# Patient Record
Sex: Male | Born: 1995 | Race: White | Hispanic: No | Marital: Single | Smoking: Former smoker
Health system: Southern US, Community
[De-identification: ages and names within clinical notes are randomized; demographics above are authoritative.]

---

## 2020-01-17 ENCOUNTER — Other Ambulatory Visit: Payer: Self-pay

## 2020-01-18 ENCOUNTER — Other Ambulatory Visit: Payer: Self-pay

## 2020-01-18 ENCOUNTER — Encounter: Payer: Self-pay | Admitting: Medical

## 2020-01-18 ENCOUNTER — Ambulatory Visit: Payer: 59 | Admitting: Medical

## 2020-01-18 VITALS — BP 118/76 | HR 99 | Ht 72.0 in | Wt 260.2 lb

## 2020-01-18 DIAGNOSIS — G8929 Other chronic pain: Secondary | ICD-10-CM

## 2020-01-18 DIAGNOSIS — D179 Benign lipomatous neoplasm, unspecified: Secondary | ICD-10-CM

## 2020-01-18 DIAGNOSIS — M545 Low back pain: Secondary | ICD-10-CM

## 2020-01-18 MED ORDER — DICLOFENAC SODIUM 75 MG PO TBEC: 75.0000 mg | DELAYED_RELEASE_TABLET | Freq: Two times a day (BID) | ORAL | 0 refills | Status: AC

## 2020-01-18 MED ORDER — CYCLOBENZAPRINE HCL 10 MG PO TABS: 10.0000 mg | ORAL_TABLET | Freq: Every day | ORAL | 0 refills | Status: AC

## 2020-01-18 NOTE — Patient Instructions (Addendum)
For recent back pain chronic and recurrent recently, will rx flexeril muscle relaxant to use at night and diclofenac nsaid to use twice daily.  Back exercises as tolerated.  If pain worsen or changes the notify us.  Defer xray to sport med if thinks necessary.  Also get opinion on lipoma vs ganglion cyst left upper ext.  Follow up date to be determined.   Back Exercises These exercises help to make your trunk and back strong. They also help to keep the lower back flexible. Doing these exercises can help to prevent back pain or lessen existing pain.  If you have back pain, try to do these exercises 2-3 times each day or as told by your doctor.  As you get better, do the exercises once each day. Repeat the exercises more often as told by your doctor.  To stop back pain from coming back, do the exercises once each day, or as told by your doctor. Exercises Single knee to chest Do these steps 3-5 times in a row for each leg: 1. Lie on your back on a firm bed or the floor with your legs stretched out. 2. Bring one knee to your chest. 3. Grab your knee or thigh with both hands and hold them it in place. 4. Pull on your knee until you feel a gentle stretch in your lower back or buttocks. 5. Keep doing the stretch for 10-30 seconds. 6. Slowly let go of your leg and straighten it. Pelvic tilt Do these steps 5-10 times in a row: 1. Lie on your back on a firm bed or the floor with your legs stretched out. 2. Bend your knees so they point up to the ceiling. Your feet should be flat on the floor. 3. Tighten your lower belly (abdomen) muscles to press your lower back against the floor. This will make your tailbone point up to the ceiling instead of pointing down to your feet or the floor. 4. Stay in this position for 5-10 seconds while you gently tighten your muscles and breathe evenly. Cat-cow Do these steps until your lower back bends more easily: 1. Get on your hands and knees on a firm  surface. Keep your hands under your shoulders, and keep your knees under your hips. You may put padding under your knees. 2. Let your head hang down toward your chest. Tighten (contract) the muscles in your belly. Point your tailbone toward the floor so your lower back becomes rounded like the back of a cat. 3. Stay in this position for 5 seconds. 4. Slowly lift your head. Let the muscles of your belly relax. Point your tailbone up toward the ceiling so your back forms a sagging arch like the back of a cow. 5. Stay in this position for 5 seconds.  Press-ups Do these steps 5-10 times in a row: 1. Lie on your belly (face-down) on the floor. 2. Place your hands near your head, about shoulder-width apart. 3. While you keep your back relaxed and keep your hips on the floor, slowly straighten your arms to raise the top half of your body and lift your shoulders. Do not use your back muscles. You may change where you place your hands in order to make yourself more comfortable. 4. Stay in this position for 5 seconds. 5. Slowly return to lying flat on the floor.  Bridges Do these steps 10 times in a row: 1. Lie on your back on a firm surface. 2. Bend your knees so they point  up to the ceiling. Your feet should be flat on the floor. Your arms should be flat at your sides, next to your body. 3. Tighten your butt muscles and lift your butt off the floor until your waist is almost as high as your knees. If you do not feel the muscles working in your butt and the back of your thighs, slide your feet 1-2 inches farther away from your butt. 4. Stay in this position for 3-5 seconds. 5. Slowly lower your butt to the floor, and let your butt muscles relax. If this exercise is too easy, try doing it with your arms crossed over your chest. Belly crunches Do these steps 5-10 times in a row: 1. Lie on your back on a firm bed or the floor with your legs stretched out. 2. Bend your knees so they point up to the  ceiling. Your feet should be flat on the floor. 3. Cross your arms over your chest. 4. Tip your chin a little bit toward your chest but do not bend your neck. 5. Tighten your belly muscles and slowly raise your chest just enough to lift your shoulder blades a tiny bit off of the floor. Avoid raising your body higher than that, because it can put too much stress on your low back. 6. Slowly lower your chest and your head to the floor. Back lifts Do these steps 5-10 times in a row: 1. Lie on your belly (face-down) with your arms at your sides, and rest your forehead on the floor. 2. Tighten the muscles in your legs and your butt. 3. Slowly lift your chest off of the floor while you keep your hips on the floor. Keep the back of your head in line with the curve in your back. Look at the floor while you do this. 4. Stay in this position for 3-5 seconds. 5. Slowly lower your chest and your face to the floor. Contact a doctor if:  Your back pain gets a lot worse when you do an exercise.  Your back pain does not get better 2 hours after you exercise. If you have any of these problems, stop doing the exercises. Do not do them again unless your doctor says it is okay. Get help right away if:  You have sudden, very bad back pain. If this happens, stop doing the exercises. Do not do them again unless your doctor says it is okay. This information is not intended to replace advice given to you by your health care provider. Make sure you discuss any questions you have with your health care provider. Document Revised: 08/24/2018 Document Reviewed: 08/24/2018 Elsevier Patient Education  2020 [employer].

## 2020-01-18 NOTE — Progress Notes (Signed)
Subjective:    Patient ID: Todd Robinson, male    DOB: 03-05-1996, 24 y.o.   MRN: [id number]  HPI  Pt in for first time.  Pt states has had no pcp since 45 yo.   Pt from [place]. Pt girlfriend graduated from [employer] so he moved down. 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 He might go to college local. Pt works out occasionally. He eats moderate healthy. Non smoker. Very rare alcohol use.  Pt has lower back pain recently. He states pain started 4-6 months ago. He was getting treated by chiropracter for weeks. Pain did decrease gradually to almost no pain but then 2 weeks ago pain came back. Pain was about 8/10 about one week ago. But now 5/10. Pt states 4 months ago had radiating pain down both legs. But no radiating pain since.  Back reoccurence did not occur with injury or excessive activity.   Also pt has small lump left mid forearm and one on upper left bicep area.   Review of Systems  Constitutional: Negative for chills, fatigue and fever.  Respiratory: Negative for cough, chest tightness, shortness of breath and wheezing.   Cardiovascular: Negative for chest pain and palpitations.  Gastrointestinal: Negative for abdominal pain, blood in stool, diarrhea, nausea and vomiting.  Musculoskeletal: Positive for back pain.  Skin: Negative for rash.       Ganglion cyst vs lipoma.  Neurological: Negative for dizziness and headaches.  Hematological: Negative for adenopathy. Does not bruise/bleed easily.  Psychiatric/Behavioral: Negative for confusion and self-injury. The patient is not nervous/anxious.     No past medical history on file.   Social History   Socioeconomic History  . Marital status: Single    Spouse name: Not on file  . Number of children: Not on file  . Years of education: Not on file  . Highest education level: Not on file  Occupational History  . Not on file  Tobacco Use  . Smoking status: Former Smoker    Packs/day: 1.00    Types:  Cigarettes  . Smokeless tobacco: Never Used  Substance and Sexual Activity  . Alcohol use: Yes    Comment: occ.  . Drug use: Never  . Sexual activity: Yes    Birth control/protection: Condom  Other Topics Concern  . Not on file  Social History Narrative  . Not on file   Social Determinants of Health   Financial Resource Strain:   . Difficulty of Paying Living Expenses: Not on file  Food Insecurity:   . Worried About Charity fundraiser in the Last Year: Not on file  . Ran Out of Food in the Last Year: Not on file  Transportation Needs:   . Lack of Transportation (Medical): Not on file  . Lack of Transportation (Non-Medical): Not on file  Physical Activity:   . Days of Exercise per Week: Not on file  . Minutes of Exercise per Session: Not on file  Stress:   . Feeling of Stress : Not on file  Social Connections:   . Frequency of Communication with Friends and Family: Not on file  . Frequency of Social Gatherings with Friends and Family: Not on file  . Attends Religious Services: Not on file  . Active Member of Clubs or Organizations: Not on file  . Attends Archivist Meetings: Not on file  . Marital Status: Not on file  Intimate Partner Violence:   . Fear of Current or Ex-Partner: Not on  file  . Emotionally Abused: Not on file  . Physically Abused: Not on file  . Sexually Abused: Not on file     Family History  Problem Relation Age of Onset  . Congenital heart disease Father     No Known Allergies  No current outpatient medications on file prior to visit.   No current facility-administered medications on file prior to visit.    BP 118/76   Pulse 99   Ht 6' (1.829 m)   Wt 260 lb 3.2 oz (118 kg)   SpO2 99%   BMI 35.29 kg/m        Objective:   Physical Exam   General Appearance- Not in acute distress.    Chest and Lung Exam Auscultation: Breath sounds:-Normal. Clear even and unlabored. Adventitious sounds:- No Adventitious  sounds.  Cardiovascular Auscultation:Rythm - Regular, rate and rythm. Heart Sounds -Normal heart sounds.  Abdomen Inspection:-Inspection Normal.  Palpation/Perucssion: Palpation and Percussion of the abdomen reveal- Non Tender, No Rebound tenderness, No rigidity(Guarding) and No Palpable abdominal masses.  Liver:-Normal.  Spleen:- Normal.   Back Mid lumbar spine tenderness to palpation.(upper l1-l2 area) Pain on straight leg lift. Pain on lateral movements and flexion/extension of the spine.  Lower ext neurologic  L5-S1 sensation intact bilaterally. Normal patellar reflexes bilaterally. No foot drop bilaterally.  Left upper ext- mid left forearm ventral aspect. I think lipoma about 2 cm in diameter. Left upper bicep- lipoma like as well.      Assessment & Plan:  For recent back pain chronic and recurrent recently, will rx flexeril muscle relaxant to use at night and diclofenac nsaid to use twice daily.  Back exercises as tolerated.  If pain worsen or changes the notify us.  Defer xray to sport med if thinks necessary.  Also get opinion on lipoma vs ganglion cyst left upper ext.  Follow up date to be determined.  Mackie Pai, PA-C

## 2020-01-28 ENCOUNTER — Other Ambulatory Visit: Payer: Self-pay

## 2020-01-28 ENCOUNTER — Ambulatory Visit (INDEPENDENT_AMBULATORY_CARE_PROVIDER_SITE_OTHER): Payer: 59 | Admitting: Family Medicine

## 2020-01-28 ENCOUNTER — Ambulatory Visit (HOSPITAL_BASED_OUTPATIENT_CLINIC_OR_DEPARTMENT_OTHER)
Admission: RE | Admit: 2020-01-28 | Discharge: 2020-01-28 | Disposition: A | Discharge status: Home / Self Care | Payer: 59 | Source: Ambulatory Visit | Attending: Family Medicine | Admitting: Family Medicine

## 2020-01-28 ENCOUNTER — Encounter: Payer: Self-pay | Admitting: Family Medicine

## 2020-01-28 ENCOUNTER — Ambulatory Visit: Payer: Self-pay

## 2020-01-28 VITALS — BP 134/78 | HR 93 | Ht 72.0 in | Wt 260.0 lb

## 2020-01-28 DIAGNOSIS — G8929 Other chronic pain: Secondary | ICD-10-CM | POA: Insufficient documentation

## 2020-01-28 DIAGNOSIS — M545 Low back pain, unspecified: Secondary | ICD-10-CM

## 2020-01-28 DIAGNOSIS — D1722 Benign lipomatous neoplasm of skin and subcutaneous tissue of left arm: Secondary | ICD-10-CM

## 2020-01-28 DIAGNOSIS — M79602 Pain in left arm: Secondary | ICD-10-CM

## 2020-01-28 NOTE — Assessment & Plan Note (Signed)
Acute on chronic in nature.  He initially had pain after sitting in a car.  Seems less likely for degenerative changes.  May be SI joint related.  Has been to a chiropractor and had some improvement. -Counseled on home exercise therapy and supportive care. -X-ray. -Could consider physical therapy.

## 2020-01-28 NOTE — Progress Notes (Signed)
Todd Robinson - 24 y.o. male MRN [id number]  Date of birth: May 17, 1996  SUBJECTIVE:  Including CC & ROS.  Chief Complaint  Patient presents with  . Back Pain    bilateral low back  . Arm Pain    knots on left arm    Todd Robinson is a 24 y.o. male that is presenting with acute on chronic low back pain and lesions on his forearm and left bicep.  The back pain is occurring for the past 6 months.  Seems to be localized to the middle of his lower back.  Initially had some sciatic type symptoms.  This seemed to resolve with chiropractor.  He has tried medications with limited improvement.  Denies any radicular symptoms today.  Symptoms can be worse with sitting or standing.  The lesions are occurring in the left volar forearm and the left bicep.  There is no pain with these lesions.  They seem to have increased in size over the years.  They have been there for several years.  Denies any numbness or tingling   Review of Systems See HPI   HISTORY: Past Medical, Surgical, Social, and Family History Reviewed & Updated per EMR.   Pertinent Historical Findings include:  History reviewed. No pertinent past medical history.  History reviewed. No pertinent surgical history.  Family History  Problem Relation Age of Onset  . Congenital heart disease Father     Social History   Socioeconomic History  . Marital status: Single    Spouse name: Not on file  . Number of children: Not on file  . Years of education: Not on file  . Highest education level: Not on file  Occupational History  . Not on file  Tobacco Use  . Smoking status: Former Smoker    Packs/day: 1.00    Types: Cigarettes  . Smokeless tobacco: Never Used  Substance and Sexual Activity  . Alcohol use: Yes    Comment: occ.  . Drug use: Never  . Sexual activity: Yes    Birth control/protection: Condom  Other Topics Concern  . Not on file  Social History Narrative  . Not on file   Social Determinants of Health   Financial  Resource Strain:   . Difficulty of Paying Living Expenses: Not on file  Food Insecurity:   . Worried About Charity fundraiser in the Last Year: Not on file  . Ran Out of Food in the Last Year: Not on file  Transportation Needs:   . Lack of Transportation (Medical): Not on file  . Lack of Transportation (Non-Medical): Not on file  Physical Activity:   . Days of Exercise per Week: Not on file  . Minutes of Exercise per Session: Not on file  Stress:   . Feeling of Stress : Not on file  Social Connections:   . Frequency of Communication with Friends and Family: Not on file  . Frequency of Social Gatherings with Friends and Family: Not on file  . Attends Religious Services: Not on file  . Active Member of Clubs or Organizations: Not on file  . Attends Archivist Meetings: Not on file  . Marital Status: Not on file  Intimate Partner Violence:   . Fear of Current or Ex-Partner: Not on file  . Emotionally Abused: Not on file  . Physically Abused: Not on file  . Sexually Abused: Not on file     PHYSICAL EXAM:  VS: BP 134/78   Pulse 93  Ht 6' (1.829 m)   Wt 260 lb (117.9 kg)   BMI 35.26 kg/m  Physical Exam Gen: NAD, alert, cooperative with exam, well-appearing MSK:  Left arm: Palpable lesions over the left anterior bicep and left volar surface of the forearm.  No ecchymosis or swelling. Normal elbow range of motion. Normal wrist range of motion. Back: Normal flexion. Limited extension secondary to pain. Normal strength with hip flexion. Negative straight leg raise. Neurovascularly intact  Limited ultrasound: Left arm:  There is a lipoma occurring over the left volar forearm surface.  The area is homogeneous with the surrounding tissue.  There is no hyperemia.  There is no invasive changes.  There is a lipoma on the left anterior bicep surface.  There is occurring in the subcutaneous tissue.  It is homogeneous with the surrounding tissue.  No hyperemia.  No  invasive changes.  Summary: 2 areas in question appear to be lipomas in the left volar forearm and left anterior bicep of the subcutaneous tissue.  Ultrasound and interpretation by Clearance Coots, MD    ASSESSMENT & PLAN:   Lipoma of left upper extremity There are 2 areas identified that appear to be lipomas.  There is no concerning identifiable changes on ultrasound. -Could consider surgery   Chronic midline low back pain without sciatica Acute on chronic in nature.  He initially had pain after sitting in a car.  Seems less likely for degenerative changes.  May be SI joint related.  Has been to a chiropractor and had some improvement. -Counseled on home exercise therapy and supportive care. -X-ray. -Could consider physical therapy.

## 2020-01-28 NOTE — Patient Instructions (Signed)
Nice to meet you Please try the exercise  Please try heat  I will call with the results   Please send me a message in MyChart with any questions or updates.  Please see me back in 4 weeks.   --Dr. Raeford Razor

## 2020-01-28 NOTE — Assessment & Plan Note (Signed)
There are 2 areas identified that appear to be lipomas.  There is no concerning identifiable changes on ultrasound. -Could consider surgery

## 2020-01-29 ENCOUNTER — Telehealth: Payer: Self-pay | Admitting: Family Medicine

## 2020-01-29 NOTE — Telephone Encounter (Signed)
Left VM for patient. If he calls back please have him speak with a nurse/CMA and inform that his xray shows a mild curvature of his spine. I think physical therapy would help most with this. We can send him somewhere if he would like.   If any questions then please take the best time and phone number to call and I will try to call him back.   Rosemarie Ax, MD [facility] 01/29/2020, 11:24 AM

## 2020-01-29 NOTE — Telephone Encounter (Signed)
Spoke to patient and gave him result information as provided by physician. 

## 2020-01-29 NOTE — Telephone Encounter (Signed)
Patient returning call for results 

## 2020-02-25 ENCOUNTER — Ambulatory Visit: Payer: 59 | Admitting: Family Medicine

## 2021-05-15 IMAGING — CR DG LUMBAR SPINE 2-3V
3 series · 3 of 3 positions shown · non-contrast
Comparison: None.

CLINICAL DATA: Low back pain

EXAM:
LUMBAR SPINE - 2-3 VIEW

[t l-spine a.p.]
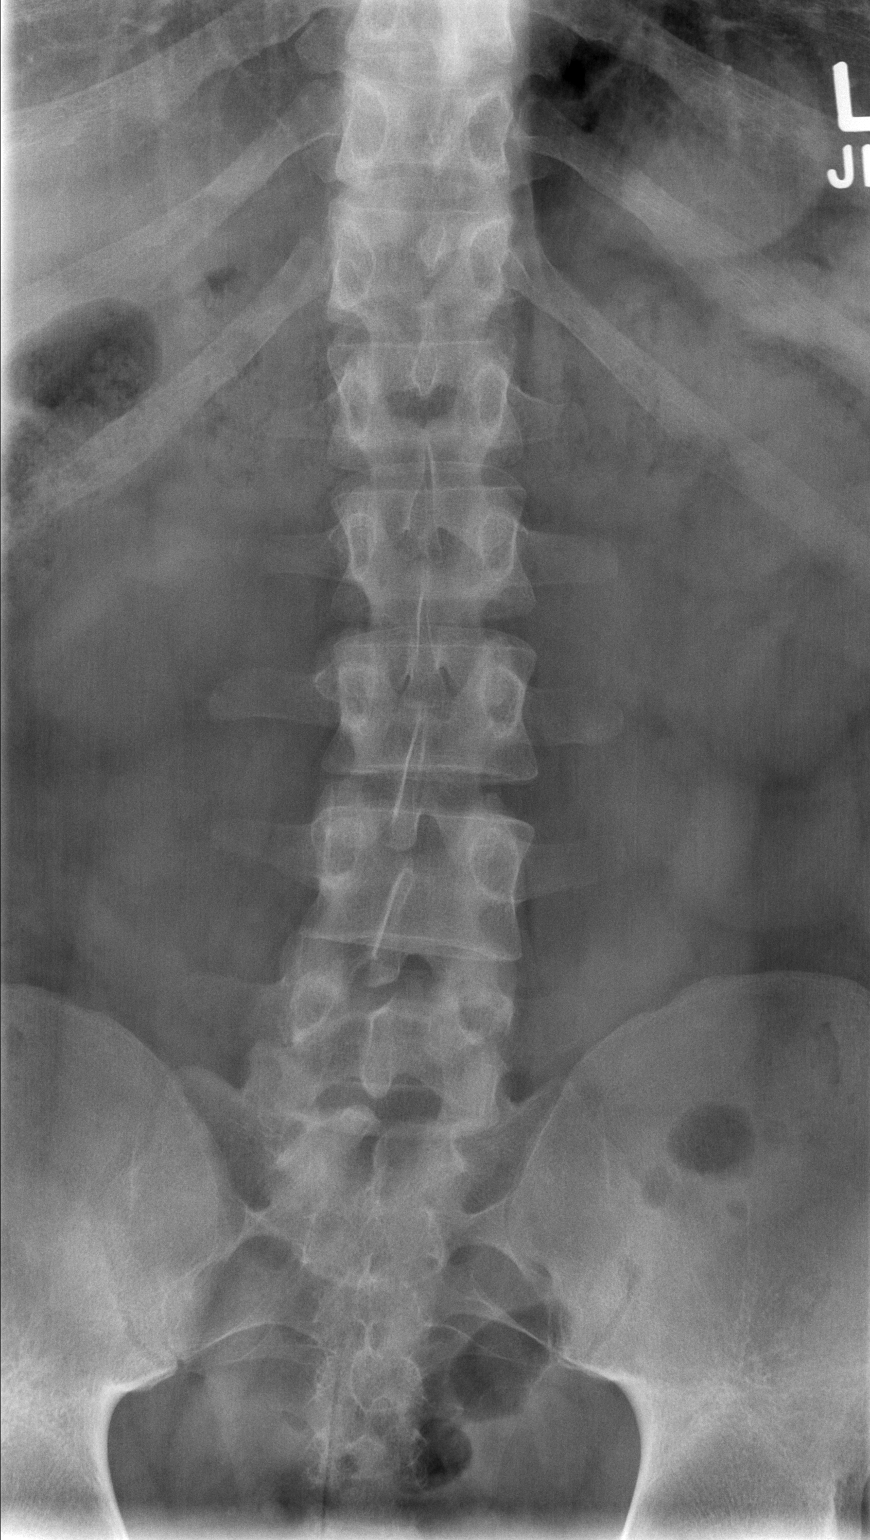

[t l-spine lat]
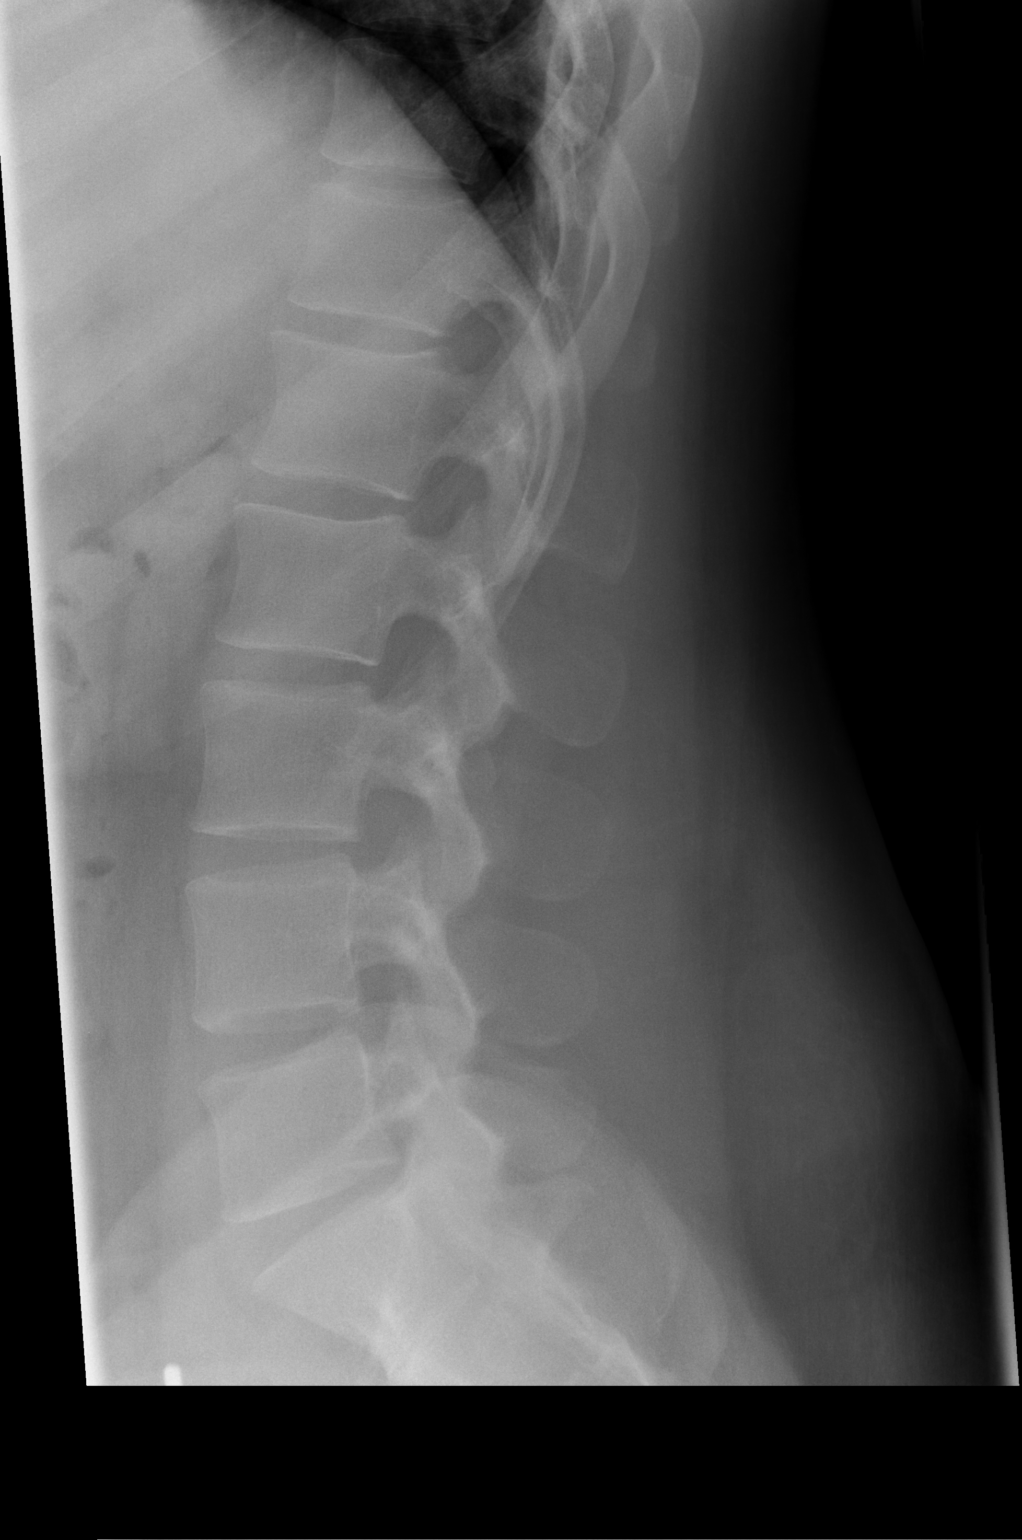

[t l-spine l5-s1 spot]
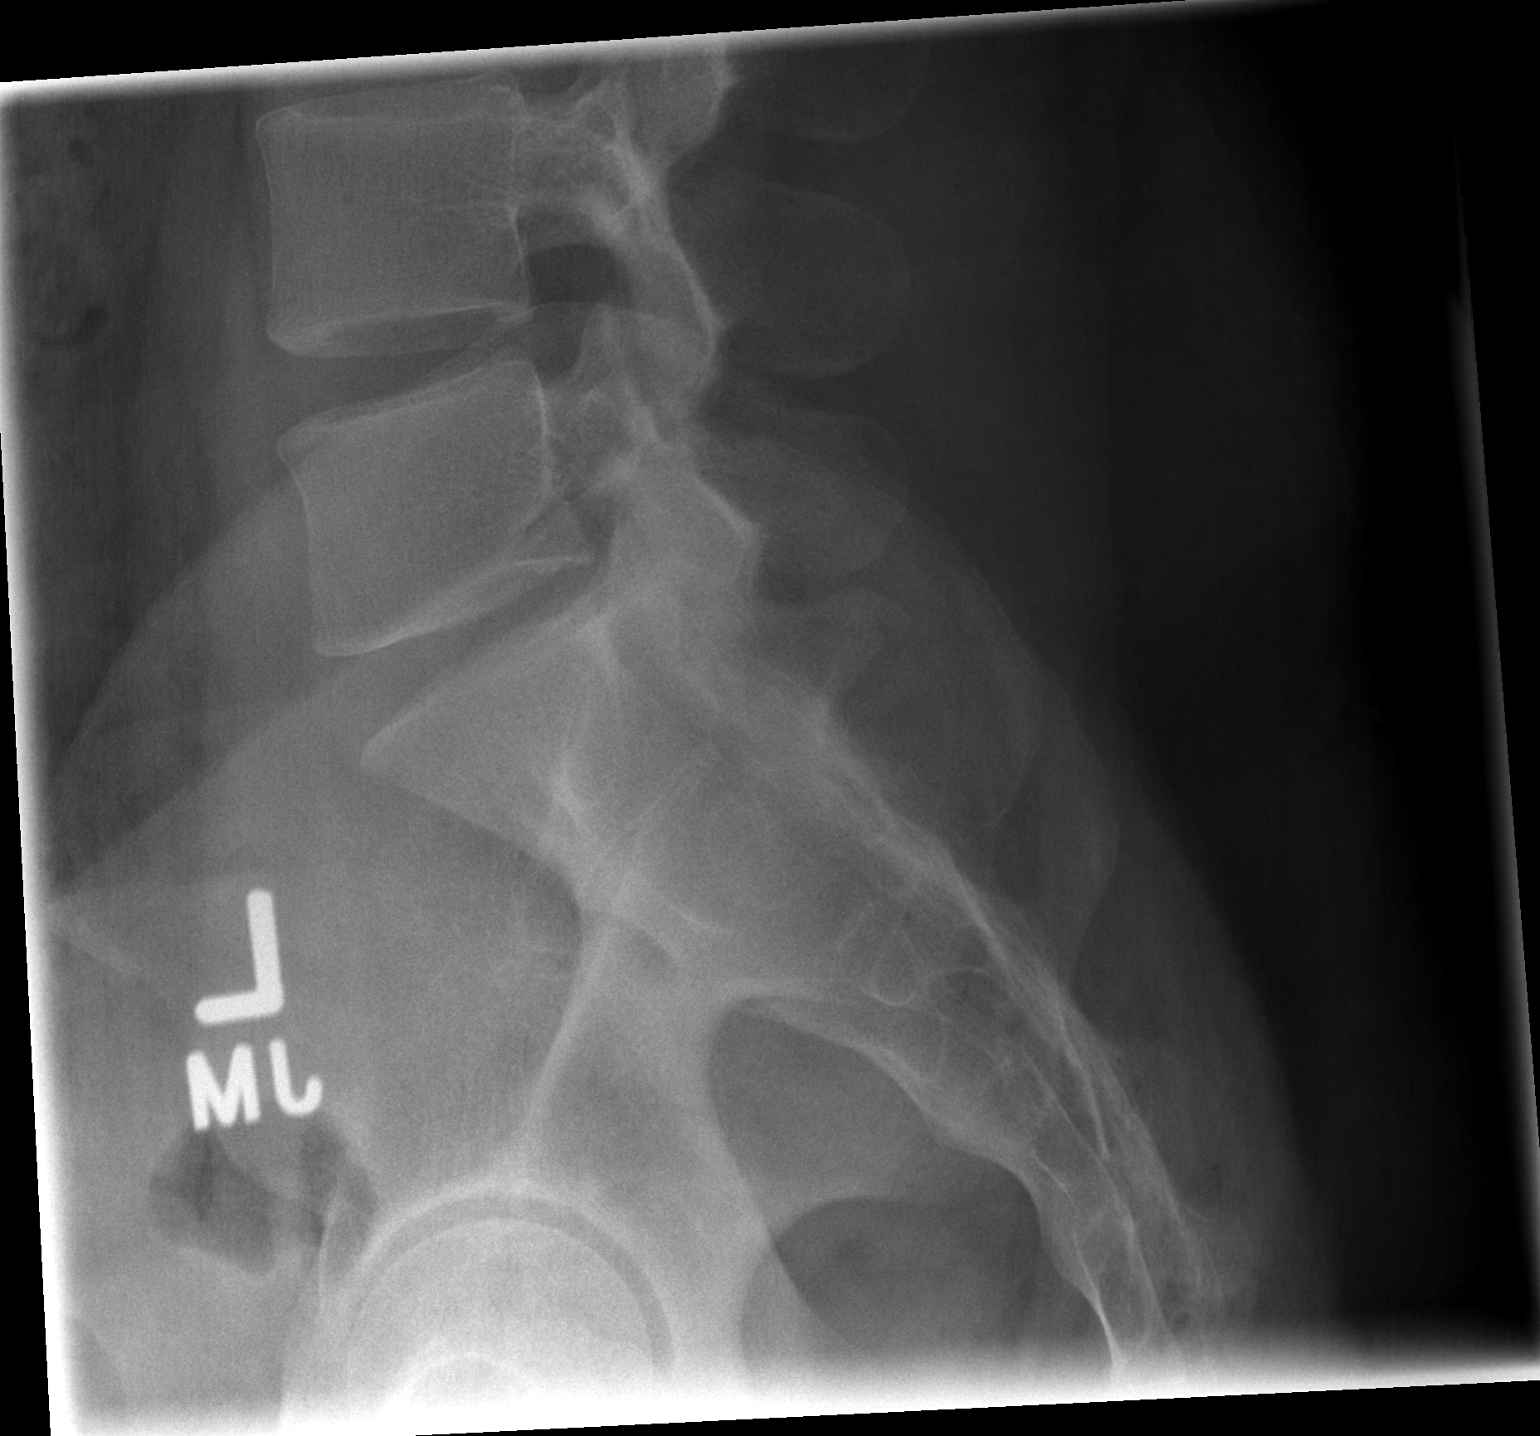

[3 of 3 positions shown; findings below may reference images not displayed]

FINDINGS: Mild levocurvature of the lumbar spine. Sagittal alignment normal.
Normal vertebral body heights and disc spaces.
IMPRESSION: Negative.

## 2023-03-29 ENCOUNTER — Encounter: Payer: Self-pay | Admitting: *Deleted
